# Patient Record
Sex: Female | Born: 1970 | Race: White | Hispanic: No | Smoking: Current every day smoker
Health system: Southern US, Community
[De-identification: ages and names within clinical notes are randomized; demographics above are authoritative.]

## PROBLEM LIST (undated history)

## (undated) DIAGNOSIS — F419 Anxiety disorder, unspecified: Secondary | ICD-10-CM

## (undated) DIAGNOSIS — F32A Depression, unspecified: Secondary | ICD-10-CM

## (undated) DIAGNOSIS — R87629 Unspecified abnormal cytological findings in specimens from vagina: Secondary | ICD-10-CM

## (undated) DIAGNOSIS — F329 Major depressive disorder, single episode, unspecified: Secondary | ICD-10-CM

## (undated) HISTORY — DX: Anxiety disorder, unspecified: F41.9

## (undated) HISTORY — DX: Depression, unspecified: F32.A

## (undated) HISTORY — DX: Unspecified abnormal cytological findings in specimens from vagina: R87.629

## (undated) HISTORY — DX: Major depressive disorder, single episode, unspecified: F32.9

---

## 2015-05-25 ENCOUNTER — Other Ambulatory Visit: Payer: Self-pay | Admitting: Nurse Practitioner

## 2015-05-25 DIAGNOSIS — N6310 Unspecified lump in the right breast, unspecified quadrant: Secondary | ICD-10-CM

## 2015-06-04 ENCOUNTER — Ambulatory Visit
Admission: RE | Admit: 2015-06-04 | Discharge: 2015-06-04 | Disposition: A | Discharge status: Home / Self Care | Payer: Medicaid Other | Source: Ambulatory Visit | Attending: Nurse Practitioner | Admitting: Nurse Practitioner

## 2015-06-04 DIAGNOSIS — N6310 Unspecified lump in the right breast, unspecified quadrant: Secondary | ICD-10-CM

## 2015-06-09 ENCOUNTER — Ambulatory Visit
Admission: RE | Admit: 2015-06-09 | Discharge: 2015-06-09 | Disposition: A | Discharge status: Home / Self Care | Payer: Medicaid Other | Source: Ambulatory Visit | Attending: Nurse Practitioner | Admitting: Nurse Practitioner

## 2015-12-03 ENCOUNTER — Other Ambulatory Visit: Payer: Self-pay | Admitting: Nurse Practitioner

## 2015-12-03 DIAGNOSIS — N6310 Unspecified lump in the right breast, unspecified quadrant: Secondary | ICD-10-CM

## 2015-12-15 ENCOUNTER — Other Ambulatory Visit: Payer: Medicaid Other

## 2015-12-21 ENCOUNTER — Ambulatory Visit
Admission: RE | Admit: 2015-12-21 | Discharge: 2015-12-21 | Disposition: A | Discharge status: Home / Self Care | Payer: Medicaid Other | Source: Ambulatory Visit | Attending: Nurse Practitioner | Admitting: Nurse Practitioner

## 2015-12-21 DIAGNOSIS — N6310 Unspecified lump in the right breast, unspecified quadrant: Secondary | ICD-10-CM

## 2016-03-20 ENCOUNTER — Encounter: Payer: Self-pay | Admitting: General Practice

## 2016-04-20 ENCOUNTER — Other Ambulatory Visit (HOSPITAL_COMMUNITY)
Admission: RE | Admit: 2016-04-20 | Discharge: 2016-04-20 | Disposition: A | Discharge status: Home / Self Care | Payer: Medicaid Other | Source: Ambulatory Visit | Attending: Obstetrics and Gynecology | Admitting: Obstetrics and Gynecology

## 2016-04-20 ENCOUNTER — Ambulatory Visit (INDEPENDENT_AMBULATORY_CARE_PROVIDER_SITE_OTHER): Payer: Medicaid Other | Admitting: Obstetrics and Gynecology

## 2016-04-20 ENCOUNTER — Ambulatory Visit (INDEPENDENT_AMBULATORY_CARE_PROVIDER_SITE_OTHER): Payer: Self-pay | Admitting: Clinical

## 2016-04-20 ENCOUNTER — Encounter: Payer: Self-pay | Admitting: Obstetrics and Gynecology

## 2016-04-20 VITALS — BP 135/80 | HR 79 | Wt 150.9 lb

## 2016-04-20 DIAGNOSIS — Z01419 Encounter for gynecological examination (general) (routine) without abnormal findings: Secondary | ICD-10-CM | POA: Diagnosis not present

## 2016-04-20 DIAGNOSIS — Z8041 Family history of malignant neoplasm of ovary: Secondary | ICD-10-CM

## 2016-04-20 DIAGNOSIS — Z0000 Encounter for general adult medical examination without abnormal findings: Secondary | ICD-10-CM

## 2016-04-20 DIAGNOSIS — Z8481 Family history of carrier of genetic disease: Secondary | ICD-10-CM

## 2016-04-20 DIAGNOSIS — F411 Generalized anxiety disorder: Secondary | ICD-10-CM

## 2016-04-20 DIAGNOSIS — Z1151 Encounter for screening for human papillomavirus (HPV): Secondary | ICD-10-CM | POA: Insufficient documentation

## 2016-04-20 NOTE — Addendum Note (Signed)
Addended by: Shelly Coss on: 04/20/2016 10:15 AM   Modules accepted: Orders

## 2016-04-20 NOTE — Progress Notes (Signed)
Subjective:     Jasmine Soto is a 46 y.o. female G3P1 with LMP 04/03/2016 who is here for a comprehensive physical exam. The patient reports prolonged periods. They occur monthly and last 10 days. This changes started a year ago. Her period previously lasted 7 days. She reports a heavier flow with passage of clots. Patient also reports a family history of ovarian cancer and she is concerned that she may have it as well. She noted an increase in her abdominal girt over the past year. She reports a good appetite and an 8 lb weight gain over the past year. She denies pelvic pain or pressure. She denies urinary incontinence  Past Medical History:  Diagnosis Date  . Anxiety   . Depression   . Vaginal Pap smear, abnormal    History reviewed. No pertinent surgical history. Family History  Problem Relation Age of Onset  . Cancer Mother     ovarian  . Cancer Sister     ovarian    Social History   Social History  . Marital status: Legally Separated    Spouse name: N/A  . Number of children: N/A  . Years of education: N/A   Occupational History  . Not on file.   Social History Main Topics  . Smoking status: Current Every Day Smoker    Packs/day: 0.50    Years: 33.00    Types: Cigarettes  . Smokeless tobacco: Never Used  . Alcohol use 1.8 oz/week    3 Glasses of wine per week  . Drug use: No  . Sexual activity: Yes    Birth control/ protection: None   Other Topics Concern  . Not on file   Social History Narrative  . No narrative on file   Health Maintenance  Topic Date Due  . HIV Screening  11/26/1984  . TETANUS/TDAP  11/26/1988  . PAP SMEAR  11/27/1990  . INFLUENZA VACCINE  10/05/2015       Review of Systems Pertinent items are noted in HPI.   Objective:      GENERAL: Well-developed, well-nourished female in no acute distress.  HEENT: Normocephalic, atraumatic. Sclerae anicteric.  NECK: Supple. Normal thyroid.  LUNGS: Clear to auscultation bilaterally.  HEART:  Regular rate and rhythm. BREASTS: Symmetric in size. No palpable masses or lymphadenopathy, skin changes, or nipple drainage. ABDOMEN: Soft, nontender, nondistended. Palpable lower pelvic mass PELVIC: Normal external female genitalia. Vagina is pink and rugated.  Normal discharge. Normal appearing cervix. Uterus is 12-weeks in size, mobile ? Fibroids on the patient's left side. No adnexal mass or tenderness. EXTREMITIES: No cyanosis, clubbing, or edema, 2+ distal pulses.    Assessment:    Healthy female exam.      Plan:    Pap smear collected Patient scheduled for mammogram next month Patient advised to perform a monthly self breast and vulva exam Pelvic ultrasound ordered BRCA testing offered today Patient will be contacted with any abnormal results RTC following ultrasound results and BRCA testing for further management See After Visit Summary for Counseling Recommendations

## 2016-04-20 NOTE — BH Specialist Note (Addendum)
Session Start time: 9:30   End Time: 10:00 Total Time:  30 minutes Type tof Service: [place] Interpreter: No.   Interpreter Name & Language: n/a # [facility] Visits July 2017-June 2018: 1s  SUBJECTIVE: Jasmine Soto is a 46 y.o. female  Pt. was referred by Dr Elly Modena for:  anxiety and depression. Pt. reports the following symptoms/concerns: Pt states that current BH meds are not working as well as previous, and primary concern is coping daily while re-establishing with psychiatry; has been on [facility] meds intermittently for at least 20 years. Pt's primary concern today is uncertainty over health, after losing both mother and sister to ovarian cancer in less than two years prior. Duration of problem:  Anxiety over two decades Severity: moderately severe Previous treatment: Treated with BH meds for about two decades  OBJECTIVE: Mood: Anxious & Affect: Appropriate Risk of harm to self or others: No known risk of harm to self or others Assessments administered: PHQ9: 16/ GAD7: 18  LIFE CONTEXT:  Family & Social: Lives with husband  School/ Work: Currently unemployed Self-Care: medication as self-care for anxiety Life changes: Mother and sister passed away in past 2 years What is important to pt/family (values): Staying healthy  GOALS ADDRESSED:  -Reduce symptoms of anxiety  INTERVENTIONS: Motivational Interviewing   ASSESSMENT:  Pt currently experiencing Generalized anxiety disorder.  Pt may benefit from psychoeducation, brief therapeutic intervention regarding coping with symptoms os anxiety, as well as referral to psychiatry for [facility] med management.Marland Kitchen   PLAN: 1. F/U with behavioral health clinician: One month, if not established with psychiatry prior to that time 2. Behavioral Health meds: Clonazepam 3. Behavioral recommendations:  -Establish care with Dr. Darleene Cleaver for [facility] med management -Read educational material regarding coping with sympoms of anxiety and  depression -Consider calm and sleep apps for additional self-coping strategy 4. Referral: Brief Counseling/Psychotherapy, Psychoeducation and Referral to [place] provider 5. From scale of 1-10, how likely are you to follow plan: [place]:   Warm Hand Off Completed.        Depression screen [facility] 2/9 04/20/2016 04/20/2016  Decreased Interest 3 3  Down, Depressed, Hopeless 3 3  PHQ - 2 Score 6 6  Altered sleeping 2 2  Tired, decreased energy 2 2  Change in appetite 2 2  Feeling bad or failure about yourself  2 2  Trouble concentrating 2 -  Suicidal thoughts - 0  PHQ-9 Score 16 14   GAD 7 : Generalized Anxiety Score 04/20/2016 04/20/2016  Nervous, Anxious, on Edge 3 3  Control/stop worrying 3 3  Worry too much - different things 3 3  Trouble relaxing 3 3  Restless 1 1  Easily annoyed or irritable 3 -  Afraid - awful might happen 2 2  Total GAD 7 Score 18 -

## 2016-04-24 LAB — CYTOLOGY - PAP
ADEQUACY: ABSENT
Diagnosis: NEGATIVE
HPV (WINDOPATH): NOT DETECTED

## 2016-04-28 ENCOUNTER — Ambulatory Visit (HOSPITAL_COMMUNITY)
Admission: RE | Admit: 2016-04-28 | Discharge: 2016-04-28 | Disposition: A | Discharge status: Home / Self Care | Payer: Medicaid Other | Source: Ambulatory Visit | Attending: Obstetrics and Gynecology | Admitting: Obstetrics and Gynecology

## 2016-04-28 ENCOUNTER — Telehealth: Payer: Self-pay

## 2016-04-28 ENCOUNTER — Ambulatory Visit: Payer: Self-pay

## 2016-04-28 DIAGNOSIS — D250 Submucous leiomyoma of uterus: Secondary | ICD-10-CM | POA: Insufficient documentation

## 2016-04-28 DIAGNOSIS — Z01419 Encounter for gynecological examination (general) (routine) without abnormal findings: Secondary | ICD-10-CM | POA: Insufficient documentation

## 2016-04-28 DIAGNOSIS — D252 Subserosal leiomyoma of uterus: Secondary | ICD-10-CM | POA: Insufficient documentation

## 2016-04-28 DIAGNOSIS — N83201 Unspecified ovarian cyst, right side: Secondary | ICD-10-CM | POA: Insufficient documentation

## 2016-04-28 NOTE — Telephone Encounter (Signed)
Patient has an upcoming appointment to discuss treatment options.

## 2016-05-01 ENCOUNTER — Other Ambulatory Visit: Payer: Self-pay | Admitting: Obstetrics and Gynecology

## 2016-05-01 ENCOUNTER — Telehealth: Payer: Self-pay | Admitting: *Deleted

## 2016-05-01 ENCOUNTER — Telehealth: Payer: Self-pay | Admitting: General Practice

## 2016-05-01 DIAGNOSIS — Z1501 Genetic susceptibility to malignant neoplasm of breast: Secondary | ICD-10-CM

## 2016-05-01 DIAGNOSIS — Z1509 Genetic susceptibility to other malignant neoplasm: Principal | ICD-10-CM

## 2016-05-01 NOTE — Telephone Encounter (Signed)
Patient called and left message requesting ultrasound results. Called and informed patient of ultrasound results & that treatment and the results would be discussed in further detail at her follow up appt. Patient verbalized understanding & had no questions

## 2016-05-01 NOTE — Telephone Encounter (Addendum)
Called pt and left message stating that I am calling with test results and information from Dr. Elly Modena. This information is not urgent. Please call back and leave a message stating whether we may leave a detailed message on her voice mail. *Per Dr. Elly Modena, the ultrasound shows the presence of a fibroid uterus. The BRCA test is still pending. Pt may have Rx for Megace 40 mg daily if desired to control the bleeding until she has follow up appt in office.  Dr. Elly Modena requested that staff send e-prescription for Megace if pt is interested.   3/5 1630  Called pt again and left message stating that I am trying to reach her with information from Dr. Elly Modena. Please call back.   3/14 1640  Called pt and left message asking her to please call back and state that she has received our messages. Also let us know if we may leave detailed information on her voice mail. In addition to the previously mentioned information pt needs to be informed that we have received a call from the lab stating that they are unable to process the [facility] test because it is not covered by her health plan. Dr. Elly Modena has been notified of this information.

## 2016-05-17 LAB — BRCAVANTAGE, COMPREHENSIVE

## 2016-05-23 ENCOUNTER — Ambulatory Visit (INDEPENDENT_AMBULATORY_CARE_PROVIDER_SITE_OTHER): Payer: Self-pay | Admitting: Obstetrics and Gynecology

## 2016-05-23 ENCOUNTER — Encounter: Payer: Self-pay | Admitting: Obstetrics and Gynecology

## 2016-05-23 ENCOUNTER — Other Ambulatory Visit: Payer: Self-pay | Admitting: Obstetrics and Gynecology

## 2016-05-23 ENCOUNTER — Ambulatory Visit (INDEPENDENT_AMBULATORY_CARE_PROVIDER_SITE_OTHER): Payer: Self-pay | Admitting: Clinical

## 2016-05-23 VITALS — BP 107/70 | HR 75 | Ht 62.0 in | Wt 146.0 lb

## 2016-05-23 DIAGNOSIS — N938 Other specified abnormal uterine and vaginal bleeding: Secondary | ICD-10-CM

## 2016-05-23 DIAGNOSIS — Z712 Person consulting for explanation of examination or test findings: Secondary | ICD-10-CM

## 2016-05-23 DIAGNOSIS — F32A Depression, unspecified: Secondary | ICD-10-CM

## 2016-05-23 DIAGNOSIS — F411 Generalized anxiety disorder: Secondary | ICD-10-CM

## 2016-05-23 DIAGNOSIS — Z8041 Family history of malignant neoplasm of ovary: Secondary | ICD-10-CM

## 2016-05-23 DIAGNOSIS — F329 Major depressive disorder, single episode, unspecified: Secondary | ICD-10-CM

## 2016-05-23 DIAGNOSIS — Z7189 Other specified counseling: Secondary | ICD-10-CM

## 2016-05-23 MED ORDER — MEDROXYPROGESTERONE ACETATE 10 MG PO TABS: 20.0000 mg | ORAL_TABLET | Freq: Every day | ORAL | 3 refills | Status: AC

## 2016-05-23 NOTE — BH Specialist Note (Signed)
Integrated Behavioral Health Follow Up Visit  MRN: [id number] Name: Jasmine Soto   Session Start time: 3:10 Session End time: 3:27 Total time: 17 minutes Number of Integrated Behavioral Health Clinician visits: 2/10  Type of Service: [place] Interpretor:No. Interpretor Name and Language: n/a   Warm Hand Off Completed.       SUBJECTIVE: Jasmine Soto is a 46 y.o. female accompanied by Female friend. Patient was referred by  Dr Elly Modena for depression and anxiety f/u. Patient reports the following symptoms/concerns: Pt states that she has not established care with psychiatry, nor with a PCP, but feels ready to make necessary calls before the end of this week to make that happen. Duration of problem: Anxiety over two decades; Severity of problem: severe  OBJECTIVE: Mood: Anxious and Affect: Appropriate Risk of harm to self or others: No plan to harm self or others   LIFE CONTEXT: Family and Social: Lives by herself School/Work: Currently unemployed Self-Care: Medication for anxiety Life Changes: Past two years, mother and sister passed away  GOALS ADDRESSED: Patient will reduce symptoms of: anxiety and depression and increase knowledge and/or ability of: self-management skills and also: Establish care with psychiatry and PCP  INTERVENTIONS: Motivational Interviewing Standardized Assessments completed: GAD-7 and PHQ 9  ASSESSMENT: Patient currently experiencing Generalized anxiety disorder. Patient may benefit from brief therapeutic intervention regarding coping with anxiety.  PLAN: 1. Follow up with behavioral health clinician on : One week via phone check, two weeks office visit(if symptoms do not improve) 2. Behavioral recommendations:  -Establish care with psychiatry for [facility] med management, within upcoming week -Establish care with PCP, within upcoming week 3. Referral(s): [place] (In Clinic),  Psychiatrist and PCP 4. "From scale of 1-10, how likely are you to follow plan?": 8  Garlan Fair, LCSWA  Depression screen [facility] 2/9 05/23/2016 04/20/2016 04/20/2016  Decreased Interest 3 3 3   Down, Depressed, Hopeless 2 3 3   PHQ - 2 Score 5 6 6   Altered sleeping 3 2 2   Tired, decreased energy 3 2 2   Change in appetite 3 2 2   Feeling bad or failure about yourself  2 2 2   Trouble concentrating 2 2 -  Moving slowly or fidgety/restless 2 - -  Suicidal thoughts 0 - 0  PHQ-9 Score 20 16 14    GAD 7 : Generalized Anxiety Score 05/23/2016 04/20/2016 04/20/2016  Nervous, Anxious, on Edge 2 3 3   Control/stop worrying 3 3 3   Worry too much - different things 3 3 3   Trouble relaxing 3 3 3   Restless 2 1 1   Easily annoyed or irritable 3 3 -  Afraid - awful might happen 2 2 2   Total GAD 7 Score 18 18 -

## 2016-05-23 NOTE — Progress Notes (Signed)
46 yo G3P1 presented today to discuss results of ultrasound and further management of vaginal bleeding. Patient reports persistent prolonged periods lasting 10 days with passage of clots.  Past Medical History:  Diagnosis Date  . Anxiety   . Depression   . Vaginal Pap smear, abnormal    No past surgical history on file. Family History  Problem Relation Age of Onset  . Cancer Mother     ovarian  . Cancer Sister     ovarian   Social History  Substance Use Topics  . Smoking status: Current Every Day Smoker    Packs/day: 0.50    Years: 33.00    Types: Cigarettes  . Smokeless tobacco: Never Used  . Alcohol use 1.8 oz/week    3 Glasses of wine per week   ROS  See pertinent in HPI  Blood pressure 107/70, pulse 75, height 5' 2"  (1.575 m), weight 146 lb (66.2 kg), last menstrual period 04/24/2016. GENERAL: Well-developed, well-nourished female in no acute distress.  NEURO: alert and oriented x 3  FINDINGS: Uterus  Measurements: 13.5 x 8.1 x 11.4 cm. Multiple uterine leiomyomata identified. Submucosal leiomyoma at upper to mid uterine segment 4.2 x 3.9 x 4.4 cm. Two additional probable leiomyomata at LEFT lateral uterus, 6.6 x 5.6 x 4.9 cm and 3.5 x 4.1 x 2.9 cm.  Endometrium  Thickness: 13 mm thick, impinged upon by a large submucosal leiomyoma as above  Right ovary  No normal appearing RIGHT ovary in tissues identified. RIGHT adnexal cyst 2.8 x 2.3 x 2.5 cm, question RIGHT ovarian versus paraovarian  Left ovary  Measurements: 3.2 x 2.7 x 2.7 cm. Dominant follicle 2.1 cm. No additional ovarian mass.  Other findings  No free pelvic fluid or additional adnexal masses  IMPRESSION: Multiple probable uterine leiomyomata including a 6.6 cm diameter LEFT lateral subserosal leiomyoma and a 4.4 cm submucosal leiomyoma impinging upon the endometrial canal. Small RIGHT adnexal cyst 2.8 cm diameter without separate visualization of a RIGHT  ovary.   Electronically Signed   By: Lavonia Dana M.D.   On: 04/28/2016 11:26  A/P 46 yo with DUB and fibroid uterus - Discussed medical management of heavy periods with provera. If successful may consider Mirena IUD - Patient is willing to pay for BRCA testing. Discussed surgical management options of both DUB with prophylactic salpingoophorectomy if BRCA positive. - Also discussed hysterectomy with salpingoophorectomy if BRCA negative for the treatment of DUB and decrease risk of ovarian ca - patient desires to defer surgical interventions at this time.  - Rx provera provided - Patient will be contacted with results

## 2016-05-23 NOTE — Progress Notes (Signed)
Patient and/or legal guardian verbally consented to meet with Behavioral Health Clinician about presenting concerns.   

## 2016-05-24 NOTE — Telephone Encounter (Signed)
Pt seen on 3/20.

## 2016-05-30 ENCOUNTER — Telehealth: Payer: Self-pay | Admitting: Clinical

## 2016-05-30 NOTE — Telephone Encounter (Signed)
Attempt to f/u with patient; left HIPPA-compliant message to return call to [phone], to [place] at [facility] for [facility] at [facility].

## 2016-06-15 LAB — COMPREHENSIVE BRCA1/2 ANALYSIS

## 2016-06-15 LAB — BRCASSURE COMPREHENSIVE TEST

## 2016-06-16 ENCOUNTER — Telehealth: Payer: Self-pay

## 2016-06-16 NOTE — Telephone Encounter (Signed)
Received called from lab (Dagny) about patient BRCA which was positive for ovarian and breast cancer. They recommended patient family get this testing as well. I have ask them to  fax over the results but nothing has came over yet. Lab direct number is [phone]

## 2016-06-21 ENCOUNTER — Telehealth: Payer: Self-pay | Admitting: *Deleted

## 2016-06-21 NOTE — Telephone Encounter (Signed)
Pt left message today @ 1314 stating she got a cal regarding a referral appt and wants to know what is going on. She is very worried. She can be reached @ [phone] or [phone]. Called pt back and informed her of appt made w/Dr. Donna Christen due to [facility] test result. This does not mean she has cancer but that she is at a higher risk to develop cancer later in life. Pt voiced understanding and stated she has spoken with a representative from the [place] earlier today and was given instructions about the appt. Marland Kitchen

## 2016-06-21 NOTE — Telephone Encounter (Signed)
Barbaraann Share, a nurse with Dr garrett at gyn/onc at the [facility] left message. She was under the impression that Dr Elly Modena wanted the patient to have a referral to Dr Donna Christen, however no referral order has been received. Dr Derek Mound will be in clinic 4/25 this month and there is a new patient opening. Dr Donna Christen will also be in clinic 5/16 and 6/27. Please call if the referral is needed so she can get scheduled.

## 2016-06-21 NOTE — Telephone Encounter (Signed)
Patient needs referral to gyn/onc per Dr Elly Modena for positive brca test. This does not mean she has cancer, just that she is high risk for cancer in the future. Scheduled appt with gyn/onc for 4/25 @ 830, patient should arrive at 815 for appt with Dr Donna Christen. Called patient, no answer- left message stating we are trying to reach you regarding results & an appt, please call us back.

## 2016-06-21 NOTE — Telephone Encounter (Signed)
I have called the patient back and gave her the date/time for her appt with Dr. Alycia Rossetti on April 25th at 71am

## 2016-06-21 NOTE — Telephone Encounter (Signed)
Received a call from [facility] regarding a referral on this patient. Appt made and nurse will call the patient.

## 2016-06-27 DIAGNOSIS — Z1509 Genetic susceptibility to other malignant neoplasm: Secondary | ICD-10-CM

## 2016-06-27 DIAGNOSIS — Z1501 Genetic susceptibility to malignant neoplasm of breast: Secondary | ICD-10-CM | POA: Insufficient documentation

## 2016-06-28 ENCOUNTER — Encounter: Payer: Self-pay | Admitting: Gynecologic Oncology

## 2016-06-28 ENCOUNTER — Other Ambulatory Visit: Payer: Self-pay | Admitting: Gynecologic Oncology

## 2016-06-28 ENCOUNTER — Ambulatory Visit: Payer: Self-pay | Attending: Gynecologic Oncology | Admitting: Gynecologic Oncology

## 2016-06-28 VITALS — BP 120/90 | HR 70 | Temp 98.3°F | Resp 20 | Ht 61.61 in | Wt 151.6 lb

## 2016-06-28 DIAGNOSIS — F329 Major depressive disorder, single episode, unspecified: Secondary | ICD-10-CM | POA: Insufficient documentation

## 2016-06-28 DIAGNOSIS — Z800 Family history of malignant neoplasm of digestive organs: Secondary | ICD-10-CM | POA: Insufficient documentation

## 2016-06-28 DIAGNOSIS — Z1501 Genetic susceptibility to malignant neoplasm of breast: Secondary | ICD-10-CM | POA: Insufficient documentation

## 2016-06-28 DIAGNOSIS — D259 Leiomyoma of uterus, unspecified: Secondary | ICD-10-CM | POA: Insufficient documentation

## 2016-06-28 DIAGNOSIS — Z833 Family history of diabetes mellitus: Secondary | ICD-10-CM | POA: Insufficient documentation

## 2016-06-28 DIAGNOSIS — Z803 Family history of malignant neoplasm of breast: Secondary | ICD-10-CM | POA: Insufficient documentation

## 2016-06-28 DIAGNOSIS — N946 Dysmenorrhea, unspecified: Secondary | ICD-10-CM | POA: Insufficient documentation

## 2016-06-28 DIAGNOSIS — F419 Anxiety disorder, unspecified: Secondary | ICD-10-CM | POA: Insufficient documentation

## 2016-06-28 DIAGNOSIS — Z1509 Genetic susceptibility to other malignant neoplasm: Secondary | ICD-10-CM

## 2016-06-28 DIAGNOSIS — F17210 Nicotine dependence, cigarettes, uncomplicated: Secondary | ICD-10-CM | POA: Insufficient documentation

## 2016-06-28 DIAGNOSIS — Z79899 Other long term (current) drug therapy: Secondary | ICD-10-CM | POA: Insufficient documentation

## 2016-06-28 DIAGNOSIS — Z8041 Family history of malignant neoplasm of ovary: Secondary | ICD-10-CM | POA: Insufficient documentation

## 2016-06-28 DIAGNOSIS — Z880 Allergy status to penicillin: Secondary | ICD-10-CM | POA: Insufficient documentation

## 2016-06-28 DIAGNOSIS — Z1502 Genetic susceptibility to malignant neoplasm of ovary: Secondary | ICD-10-CM

## 2016-06-28 NOTE — Progress Notes (Signed)
Consult Note: Gyn-Onc New Patient consultation  Jasmine Soto 46 y.o. female  CC:  Chief Complaint  Patient presents with  . BRCA 1 Gene Mutation Positive    HPI: Patient is seen in consultation today at the request of Dr. Mora Bellman.  Patient is a very pleasant 2 her old gravida 3 para 1 who was diagnosed with a pathogenic mutation of BRCA1. Her mutation is [id number]. Her history is notable for her mother who was diagnosed with ovarian cancer at age of 60 past with the age of 80. Her sister was diagnosed with ovarian cancer at the age of 75 was initially in remission and then suffered a recurrence and died at the age of 77. As a result she underwent genetic testing and was found a positive for BRCA1. She states that she is off this testing for many years. There is only one family member with breast cancer that she is aware. She's a paternal aunt to had breast cancer in her 61s. Paternal grandfather had some type of stomach cancer but he was much older. It is been a very difficult year for her, her only son who is 24 died unexpectedly probably from undiagnosed diabetes and an episode of severe hypoglycemia. She states that when emergency technicians arrived his sugar was 14 and they were not able to survive him. She has one sister who is alive but she is adopted.  She was seen by Dr. Elly Modena who obtained this family history and appropriately ordered the genetic testing. On February 15 she had a Pap smear that was unremarkable. She had negative high risk HPV testing. She didn't undergo a transvaginal ultrasound on 04/28/2016 that revealed the uterus to be 13.5 x 8.1 x 11.4 cm with multiple fibroids. She has submucosal myoma at the upper to mid uterine segment measuring 4.2 x 3.9 x 4.4 cm. She had 2 additional probable myomas on the left lateral uterus measuring 6.6 x 5.6 x 4.9 cm and 3.5 x 4.1 x 2.9 cm. The endometrial stripe is 13 mm. Should her normal appearing right adnexa with questionable  right adnexal cyst measuring 2.8 x 2.3 x 2.5 cm. The left ovary measured 3.2 x 2.7 x 2.7 cm with a dominant follicle measuring 2.1 cm.  She's currently on her menstrual cycle and suffers from fairly significant dysmenorrhea as well as heavy bleeding. Her cycles are about every 21 day she starting to have some vasomotor symptoms. She denies any change in her bowel or bladder habits. She is up-to-date on her mammograms but she did have additional views October 2017 and they recommended diagnostic mammogram and ultrasound in March. She has not yet scheduled this. She has not been seen by a breast surgeon or plastic surgeon yet. She states on the first "stop" on her journey after having her BRCA1 mutation diagnosed. She is currently on her cycle. She comes accompanied by her significant other. She is requesting no exam and she thought that this was primarily consultative visit.  Review of Systems  Constitutional: Denies fever. Skin: No rash Cardiovascular: No chest pain, shortness of breath, or edema  Pulmonary: No cough, + smoker Gastro Intestinal: No nausea, vomiting, constipation, or diarrhea reported. Reporting some bloating and that she feels her stomach is bigger than it has been previously. Genitourinary: No frequency, urgency, or dysuria.   Psychology: Slowly adjusting to the loss of her son. She states is been a very difficult year for her.  Current Meds:  Outpatient Encounter Prescriptions as of 06/28/2016  Medication Sig  . ALPRAZolam (XANAX PO) Take 1 mg by mouth.   . gabapentin (NEURONTIN) 800 MG tablet Take 800 mg by mouth at bedtime.  . medroxyPROGESTERone (PROVERA) 10 MG tablet Take 2 tablets (20 mg total) by mouth daily.  Marland Kitchen omeprazole (PRILOSEC) 40 MG capsule Take 40 mg by mouth daily.  Marland Kitchen topiramate (TOPAMAX) 25 MG capsule Take 25 mg by mouth 2 (two) times daily. Patient unsure of dose   No facility-administered encounter medications on file as of 06/28/2016.     Allergy:   Allergies  Allergen Reactions  . Penicillins Nausea Only    Social Hx:   Social History   Social History  . Marital status: Legally Separated    Spouse name: N/A  . Number of children: N/A  . Years of education: N/A   Occupational History  . Not on file.   Social History Main Topics  . Smoking status: Current Every Day Smoker    Packs/day: 0.50    Years: 33.00    Types: Cigarettes  . Smokeless tobacco: Never Used  . Alcohol use 1.8 oz/week    3 Glasses of wine per week  . Drug use: No  . Sexual activity: Yes    Birth control/ protection: None   Other Topics Concern  . Not on file   Social History Narrative  . No narrative on file    Past Surgical Hx: History reviewed. No pertinent surgical history.  Past Medical Hx:  Past Medical History:  Diagnosis Date  . Anxiety   . Depression   . Vaginal Pap smear, abnormal     Oncology Hx:   No history exists.    Family Hx:  Family History  Problem Relation Age of Onset  . Cancer Mother 67    ovarian  . Cancer Sister 57    ovarian  . Diabetes Son 34  . Cancer Paternal Aunt 34    breast  . Cancer Paternal Grandfather 79    stomach    Vitals:  Blood pressure 120/90, pulse 70, temperature 98.3 F (36.8 C), temperature source Oral, resp. rate 20, height 5' 1.61" (1.565 m), weight 151 lb 9.6 oz (68.8 kg).  Physical Exam: Well-nourished vulva female in no acute distress. Examination was deferred at the patient's request.  Assessment/Plan: Patient is a 46 year old gravida 3 para 1 who comes in today secondary to being diagnosed with a pathogenic BRCA1 mutation. 1) We discussed her BRCA mutation and the increased risk of both breast as well as ovarian cancer. Her family history appears to be one that is more concerning for ovarian cancer and we know that the phenotype of this mutation can vary between families. We discussed with her that she has anywhere from a 40-70% chance of developing ovarian cancer by the age  of 46. We also discussed the risk of breast cancer and that she has at least a 70% lifetime risk of developing breast cancer by the age of 46. This reduction surgery including removal of the bilateral tubes and ovaries can decrease her risk of ovarian cancer significantly but it will not be 0 as there continues to be a 1-3% risk of developing primary peritoneal carcinoma. We discussed that she would still need an annual CA-125. We also discussed risks reduction surgery and that this involves a number of options that she would need to discuss both with her breast surgeon as well as plastic surgery. Until then I recommended that she be seen in the high risk breast  clinic and undergo intermittent mammograms and MRIs on an every six-month basis.  2) She does have bulk symptoms and painful periods from her fibroids. I think that secondary to her known fibroids with bulk symptoms as well as a small increased risk of uterine serous carcinomas in patients are BRCA1 affected I believe it would be reasonable to proceed with a hysterectomy at the time of her risks reduction surgery. I discussed with her that we will obtain abdominal pelvic washings, that we would remove the adnexa with a 2 cm margin on the blood vessels and of course alert pathology to her diagnosis so that the fallopian tubes and ovaries could be serially sectioned and evaluated appropriately. More than likely this can be accomplished in a minimally invasive fashion. However, as the patient did not wish to have an exam today I will need to defer final decision regarding removed of surgery until we perform an exam. If we cannot proceed with a minimally invasive procedure she will most likely be a candidate for a low transverse incision.  3) She believes that she'll want to have the surgery in the near future but really wants to think about all of the options and think about time off of work. She has printed information regarding recovery from hysterectomy  as well as printed information from the [employer] regarding BRCA mutations. She is aware that I will place a referral for the high risk breast clinic and they will contact her directly with an appointment.  4) We discussed smoking cessation. The patient understands the need to quit smoking and she will address this when she obtains a primary care physician. She also needs to obtain a psychiatrist as she is running out of some her psychiatric medications and her psychiatrist is moved. She was provided with names of psychiatrist and she will take responsibility of contacting him directly.  She will contact me when she wishes to move forward with surgery. She understands that we'll need to proceed with a physical examination to complete her history and physical.  Her questions as well as those of her significant other were elicited in answer to their satisfaction. Greater than 1 hour face-to-face time with spent the patient and her partner of which all the time was spent in consultation.  I appreciate the opportunity to partner in the care of this very pleasant patient. , A., MD 06/28/2016, 3:49 PM  

## 2016-06-28 NOTE — Patient Instructions (Signed)
Total Laparoscopic Hysterectomy A total laparoscopic hysterectomy is a minimally invasive surgery to remove your uterus and cervix. This surgery is performed by making several small cuts (incisions) in your abdomen. It can also be done with a thin, lighted tube (laparoscope) inserted into two small incisions in your lower abdomen. Your fallopian tubes and ovaries can be removed (bilateral salpingo-oophorectomy) during this surgery as well.Benefits of minimally invasive surgery include:  Less pain.  Less risk of blood loss.  Less risk of infection.  Quicker return to normal activities.  Tell a health care provider about:  Any allergies you have.  All medicines you are taking, including vitamins, herbs, eye drops, creams, and over-the-counter medicines.  Any problems you or family members have had with anesthetic medicines.  Any blood disorders you have.  Any surgeries you have had.  Any medical conditions you have. What are the risks? Generally, this is a safe procedure. However, as with any procedure, complications can occur. Possible complications include:  Bleeding.  Blood clots in the legs or lung.  Infection.  Injury to surrounding organs.  Problems with anesthesia.  Early menopause symptoms (hot flashes, night sweats, insomnia).  Risk of conversion to an open abdominal incision.  What happens before the procedure?  Ask your health care provider about changing or stopping your regular medicines.  Do not take aspirin or blood thinners (anticoagulants) for 1 week before the surgery or as told by your health care provider.  Do not eat or drink anything for 8 hours before the surgery or as told by your health care provider.  Quit smoking if you smoke.  Arrange for a ride home after surgery and for someone to help you at home during recovery. What happens during the procedure?  You will be given antibiotic medicine.  An IV tube will be placed in your arm. You  will be given medicine to make you sleep (general anesthetic).  A gas (carbon dioxide) will be used to inflate your abdomen. This will allow your surgeon to look inside your abdomen, perform your surgery, and treat any other problems found if necessary.  Three or four small incisions (often less than 1/2 inch) will be made in your abdomen. One of these incisions will be made in the area of your belly button (navel). The laparoscope will be inserted into the incision. Your surgeon will look through the laparoscope while doing your procedure.  Other surgical instruments will be inserted through the other incisions.  Your uterus may be removed through your vagina or cut into small pieces and removed through the small incisions.  Your incisions will be closed. What happens after the procedure?  The gas will be released from inside your abdomen.  You will be taken to the recovery area where a nurse will watch and check your progress. Once you are awake, stable, and taking fluids well, without other problems, you will return to your room or be allowed to go home.  There is usually minimal discomfort following the surgery because the incisions are so small.  You will be given pain medicine while you are in the hospital and for when you go home. This information is not intended to replace advice given to you by your health care provider. Make sure you discuss any questions you have with your health care provider. Document Released: 12/18/2006 Document Revised: 07/29/2015 Document Reviewed: 09/10/2012 Elsevier Interactive Patient Education  2017 [employer] Total Laparoscopic Hysterectomy, Care After Refer to this sheet in the next few   weeks. These instructions provide you with information on caring for yourself after your procedure. Your health care provider may also give you more specific instructions. Your treatment has been planned according to current medical practices, but problems sometimes  occur. Call your health care provider if you have any problems or questions after your procedure. What can I expect after the procedure?  Pain and bruising at the incision sites. You will be given pain medicine to control it.  Menopausal symptoms such as hot flashes, night sweats, and insomnia if your ovaries were removed.  Sore throat from the breathing tube that was inserted during surgery. Follow these instructions at home:  Only take over-the-counter or prescription medicines for pain, discomfort, or fever as directed by your health care provider.  Do not take aspirin. It can cause bleeding.  Do not drive when taking pain medicine.  Follow your health care provider's advice regarding diet, exercise, lifting, driving, and general activities.  Resume your usual diet as directed and allowed.  Get plenty of rest and sleep.  Do not douche, use tampons, or have sexual intercourse for at least 6 weeks, or until your health care provider gives you permission.  Change your bandages (dressings) as directed by your health care provider.  Monitor your temperature and notify your health care provider of a fever.  Take showers instead of baths for 2-3 weeks.  Do not drink alcohol until your health care provider gives you permission.  If you develop constipation, you may take a mild laxative with your health care provider's permission. Bran foods may help with constipation problems. Drinking enough fluids to keep your urine clear or pale yellow may help as well.  Try to have someone home with you for 1-2 weeks to help around the house.  Keep all of your follow-up appointments as directed by your health care provider. Contact a health care provider if:  You have swelling, redness, or increasing pain around your incision sites.  You have pus coming from your incision.  You notice a bad smell coming from your incision.  Your incision breaks open.  You feel dizzy or  lightheaded.  You have pain or bleeding when you urinate.  You have persistent diarrhea.  You have persistent nausea and vomiting.  You have abnormal vaginal discharge.  You have a rash.  You have any type of abnormal reaction or develop an allergy to your medicine.  You have poor pain control with your prescribed medicine. Get help right away if:  You have chest pain or shortness of breath.  You have severe abdominal pain that is not relieved with pain medicine.  You have pain or swelling in your legs. This information is not intended to replace advice given to you by your health care provider. Make sure you discuss any questions you have with your health care provider. Document Released: 12/11/2012 Document Revised: 07/29/2015 Document Reviewed: 09/10/2012 Elsevier Interactive Patient Education  2017 [employer]  

## 2016-07-21 ENCOUNTER — Telehealth: Payer: Self-pay | Admitting: *Deleted

## 2016-07-21 NOTE — Telephone Encounter (Signed)
Patient called and made an appt to see Dr. Alycia Rossetti for possible surgery. Appt made on June 27th at 10:15am.

## 2016-08-30 ENCOUNTER — Telehealth: Payer: Self-pay | Admitting: Gynecologic Oncology

## 2016-08-30 ENCOUNTER — Ambulatory Visit: Payer: Self-pay | Admitting: Gynecologic Oncology

## 2016-08-30 NOTE — Progress Notes (Deleted)
Gynecologic Oncology Return Patient Visit  Jasmine Soto 46 y.o. female  CC:  No chief complaint on file.   HPI: Patient was seen in consultation today at the request of Dr. Mora Bellman.  Patient is a very pleasant 71 her old gravida 3 para 1 who was diagnosed with a pathogenic mutation of BRCA1. Her mutation is [id number]. Her history is notable for her mother who was diagnosed with ovarian cancer at age of 46 past with the age of 51. Her sister was diagnosed with ovarian cancer at the age of 80 was initially in remission and then suffered a recurrence and died at the age of 31. As a result she underwent genetic testing and was found a positive for BRCA1. She states that she is off this testing for many years. There is only one family member with breast cancer that she is aware. She's a paternal aunt to had breast cancer in her 81s. Paternal grandfather had some type of stomach cancer but he was much older. It is been a very difficult year for her, her only son who is 24 died unexpectedly probably from undiagnosed diabetes and an episode of severe hypoglycemia. She states that when emergency technicians arrived his sugar was 14 and they were not able to survive him. She has one sister who is alive but she is adopted.  She was seen by Dr. Elly Modena who obtained this family history and appropriately ordered the genetic testing. On February 15 she had a Pap smear that was unremarkable. She had negative high risk HPV testing. She didn't undergo a transvaginal ultrasound on 04/28/2016 that revealed the uterus to be 13.5 x 8.1 x 11.4 cm with multiple fibroids. She has submucosal myoma at the upper to mid uterine segment measuring 4.2 x 3.9 x 4.4 cm. She had 2 additional probable myomas on the left lateral uterus measuring 6.6 x 5.6 x 4.9 cm and 3.5 x 4.1 x 2.9 cm. The endometrial stripe is 13 mm. Should her normal appearing right adnexa with questionable right adnexal cyst measuring 2.8 x 2.3 x 2.5 cm. The  left ovary measured 3.2 x 2.7 x 2.7 cm with a dominant follicle measuring 2.1 cm.  On 08/25/2016 she underwent a total robotic hysterectomy bilateral salpingo-oophorectomy with washings. Her surgery was uncomplicated. Her pathology revealed:  A: Uterus, cervix, bilateral tubes and ovaries, hysterectomy and bilateral salpingo-oophorectomy  Cervix: - Benign squamous mucosa and endocervical glands - No dysplasia or malignancy identified  Endomyometrium: - Benign secretory endometrium with tubal metaplasia - Adenomyosis - Leiomyomata with myxoid change, up to 4.5cm in greatest dimension  Left ovary: - Benign ovary with physiologic changes - No atypia or malignancy identified  Left fallopian tube: - Benign fallopian tube and fimbriated end, entirely submitted - No atypia or malignancy identified  Right ovary: - Benign ovary with physiologic changes and corpus luteal cyst (1.4cm) - No atypia or malignancy identified  Right fallopian tube:  - Benign fallopian tube and fimbriated end, entirely submitted - No atypia or malignancy identified  She comes in today for brief postop check.  Review of Systems  Constitutional: Denies fever. Skin: No rash Cardiovascular: No chest pain, shortness of breath, or edema  Pulmonary: No cough, + smoker Gastro Intestinal: No nausea, vomiting, constipation, or diarrhea reported. Reporting some bloating and that she feels her stomach is bigger than it has been previously. Genitourinary: No frequency, urgency, or dysuria.   Psychology: Slowly adjusting to the loss of her son. She states is been a  very difficult year for her.  Current Meds:  Outpatient Encounter Prescriptions as of 08/30/2016  Medication Sig  . ALPRAZolam (XANAX PO) Take 1 mg by mouth.   . gabapentin (NEURONTIN) 800 MG tablet Take 800 mg by mouth at bedtime.  . medroxyPROGESTERone (PROVERA) 10 MG tablet Take 2 tablets (20 mg total) by mouth daily.  Marland Kitchen omeprazole (PRILOSEC) 40 MG  capsule Take 40 mg by mouth daily.  Marland Kitchen topiramate (TOPAMAX) 25 MG capsule Take 25 mg by mouth 2 (two) times daily. Patient unsure of dose   No facility-administered encounter medications on file as of 08/30/2016.     Allergy:  Allergies  Allergen Reactions  . Penicillins Nausea Only    Social Hx:   Social History   Social History  . Marital status: Legally Separated    Spouse name: N/A  . Number of children: N/A  . Years of education: N/A   Occupational History  . Not on file.   Social History Main Topics  . Smoking status: Current Every Day Smoker    Packs/day: 0.50    Years: 33.00    Types: Cigarettes  . Smokeless tobacco: Never Used  . Alcohol use 1.8 oz/week    3 Glasses of wine per week  . Drug use: No  . Sexual activity: Yes    Birth control/ protection: None   Other Topics Concern  . Not on file   Social History Narrative  . No narrative on file    Past Surgical Hx: No past surgical history on file.  Past Medical Hx:  Past Medical History:  Diagnosis Date  . Anxiety   . Depression   . Vaginal Pap smear, abnormal     Oncology Hx:   No history exists.    Family Hx:  Family History  Problem Relation Age of Onset  . Cancer Mother 52       ovarian  . Cancer Sister 47       ovarian  . Diabetes Son 69  . Cancer Paternal Aunt 72       breast  . Cancer Paternal Grandfather 21       stomach    Vitals:  There were no vitals taken for this visit.  Physical Exam: Well-nourished vulva female in no acute distress. Examination was deferred at the patient's request.  Assessment/Plan: Patient is a 46 year old gravida 3 para 1 who comes in today secondary to being diagnosed with a pathogenic BRCA1 mutation. 1) We discussed her BRCA mutation and the increased risk of both breast as well as ovarian cancer. Her family history appears to be one that is more concerning for ovarian cancer and we know that the phenotype of this mutation can vary between  families. We discussed with her that she has anywhere from a 40-70% chance of developing ovarian cancer by the age of 24. We also discussed the risk of breast cancer and that she has at least a 70% lifetime risk of developing breast cancer by the age of 1. This reduction surgery including removal of the bilateral tubes and ovaries can decrease her risk of ovarian cancer significantly but it will not be 0 as there continues to be a 1-3% risk of developing primary peritoneal carcinoma. We discussed that she would still need an annual CA-125. We also discussed risks reduction surgery and that this involves a number of options that she would need to discuss both with her breast surgeon as well as plastic surgery. Until then I recommended that  she be seen in the high risk breast clinic and undergo intermittent mammograms and MRIs on an every six-month basis.  2) She does have bulk symptoms and painful periods from her fibroids. I think that secondary to her known fibroids with bulk symptoms as well as a small increased risk of uterine serous carcinomas in patients are BRCA1 affected I believe it would be reasonable to proceed with a hysterectomy at the time of her risks reduction surgery. I discussed with her that we will obtain abdominal pelvic washings, that we would remove the adnexa with a 2 cm margin on the blood vessels and of course alert pathology to her diagnosis so that the fallopian tubes and ovaries could be serially sectioned and evaluated appropriately. More than likely this can be accomplished in a minimally invasive fashion. However, as the patient did not wish to have an exam today I will need to defer final decision regarding removed of surgery until we perform an exam. If we cannot proceed with a minimally invasive procedure she will most likely be a candidate for a low transverse incision.  3) She believes that she'll want to have the surgery in the near future but really wants to think about all  of the options and think about time off of work. She has printed information regarding recovery from hysterectomy as well as printed information from the [facility] regarding BRCA mutations. She is aware that I will place a referral for the high risk breast clinic and they will contact her directly with an appointment.  4) We discussed smoking cessation. The patient understands the need to quit smoking and she will address this when she obtains a primary care physician. She also needs to obtain a psychiatrist as she is running out of some her psychiatric medications and her psychiatrist is moved. She was provided with names of psychiatrist and she will take responsibility of contacting him directly.  She will contact me when she wishes to move forward with surgery. She understands that we'll need to proceed with a physical examination to complete her history and physical.  Her questions as well as those of her significant other were elicited in answer to their satisfaction. Greater than 1 hour face-to-face time with spent the patient and her partner of which all the time was spent in consultation.  I appreciate the opportunity to partner in the care of this very pleasant patient. Constanza Mincy A., MD 08/30/2016, 8:18 AM

## 2016-08-30 NOTE — Telephone Encounter (Signed)
Patient returned call to the office.  Patient stating she needs more pain medication.  She states she has been taking ibuprofen and tylenol in between but she is out of stronger medication.  Situation discussed with Dr. Alycia Rossetti.  Refill sent through [facility].  Patient advised she must have a follow up appt before any additional medication is given.  Advised to reach out to [facility] about her follow up appt there.  Doing well post-op besides intermittent moderate post-op pain.  Advised to call for any needs

## 2016-10-27 ENCOUNTER — Other Ambulatory Visit: Payer: Self-pay | Admitting: Gynecologic Oncology

## 2018-02-26 IMAGING — US US PELVIS COMPLETE
1 series · 15 of 25 positions shown · non-contrast
Comparison: None

CLINICAL DATA: Pelvic mass

EXAM:
TRANSABDOMINAL AND TRANSVAGINAL ULTRASOUND OF PELVIS
TECHNIQUE: Both transabdominal and transvaginal ultrasound examinations of the
pelvis were performed. Transabdominal technique was performed for
global imaging of the pelvis including uterus, ovaries, adnexal
regions, and pelvic cul-de-sac. It was necessary to proceed with
endovaginal exam following the transabdominal exam to visualize the
uterine masses.

[Series 1: us pelvis complete · 103 acquisitions, 15 frames shown]
[im 1/103]
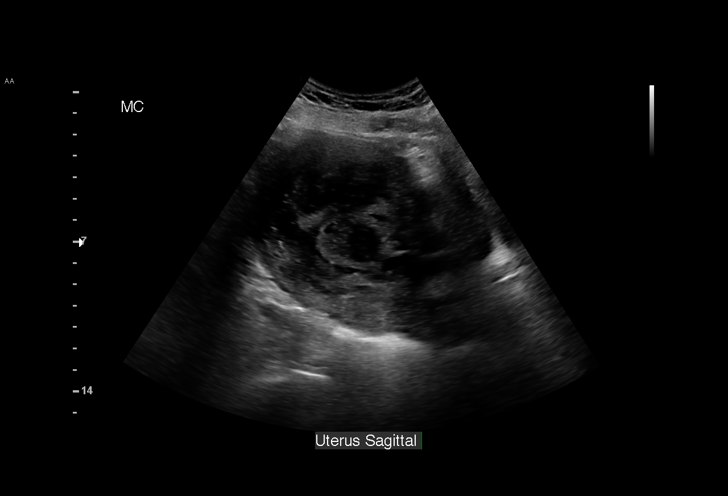
[im 9/103]
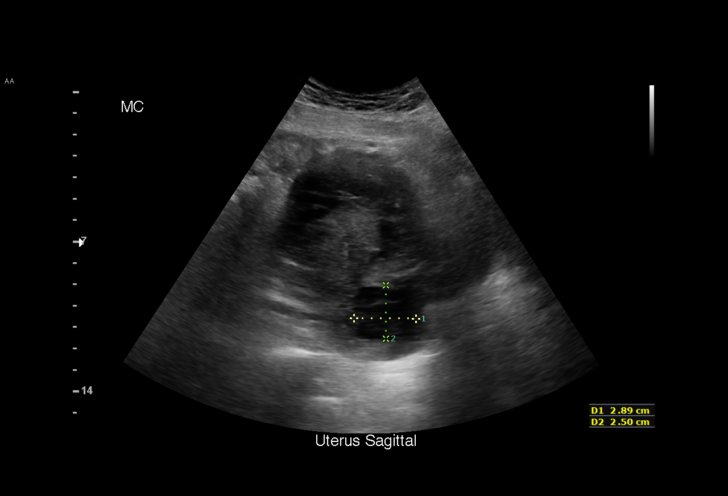
[im 18/103]
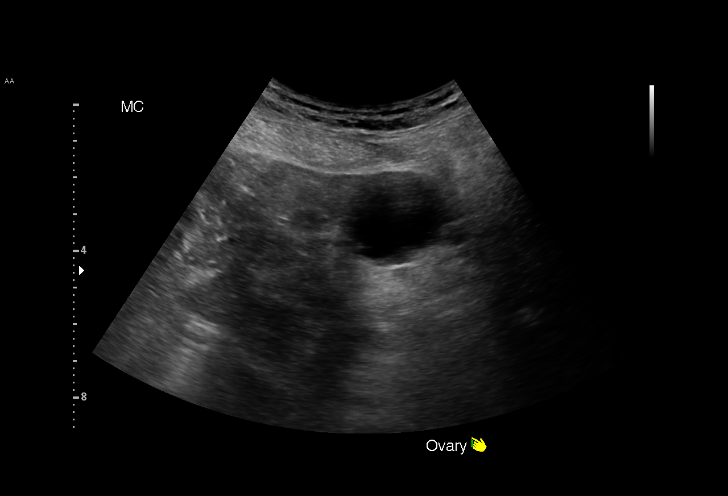
[im 22/103]
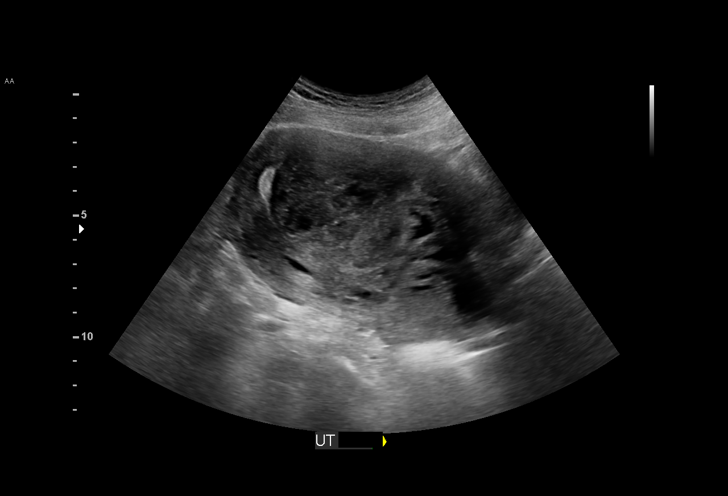
[im 30/103]
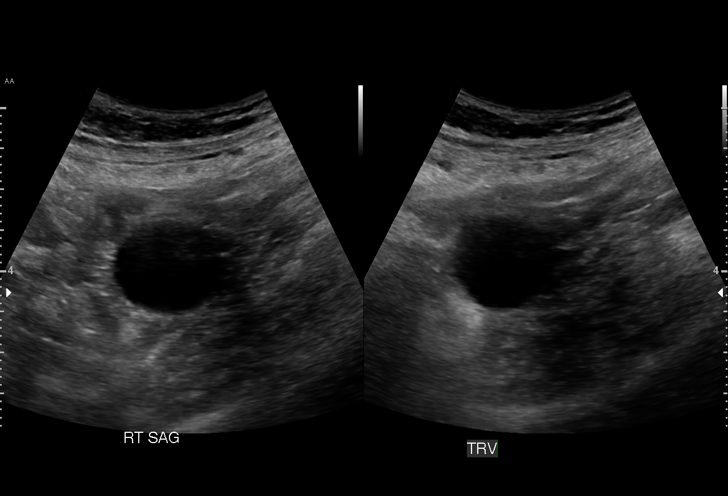
[im 39/103]
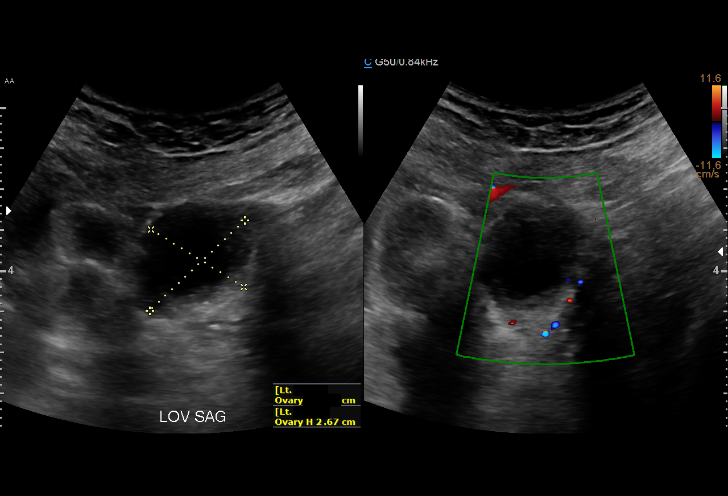
[im 43/103]
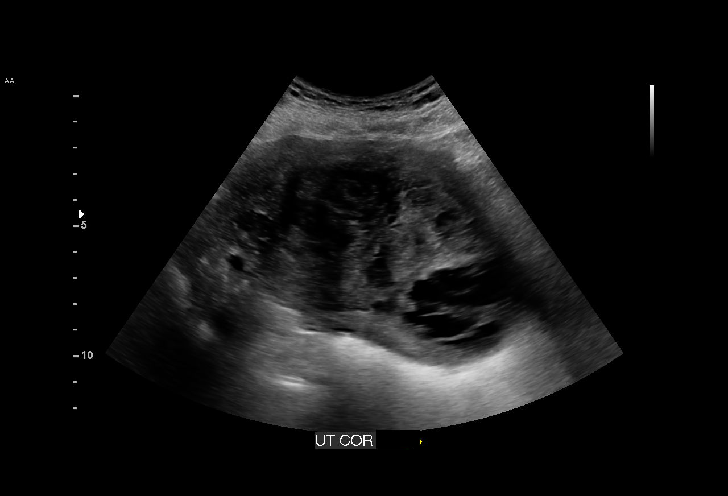
[im 52/103]
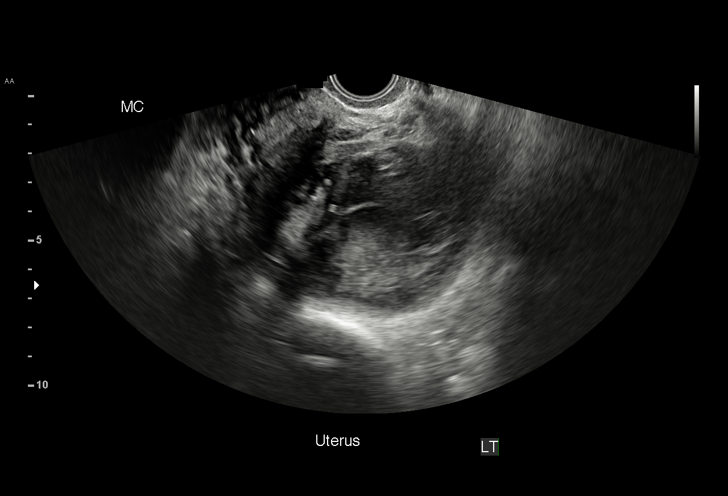
[im 60/103]
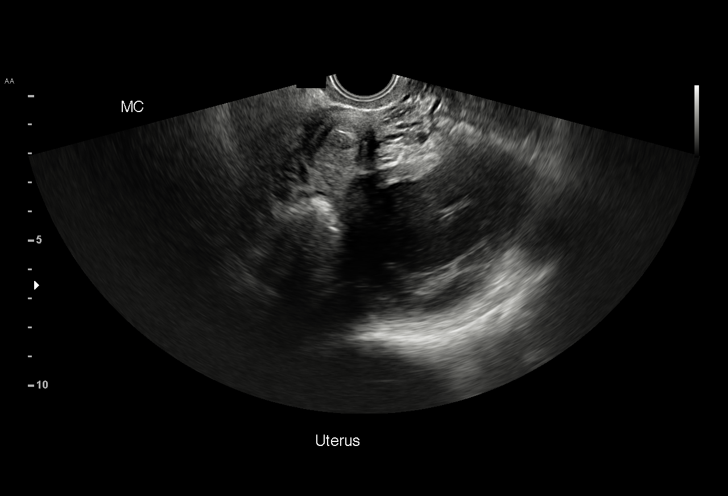
[im 64/103]
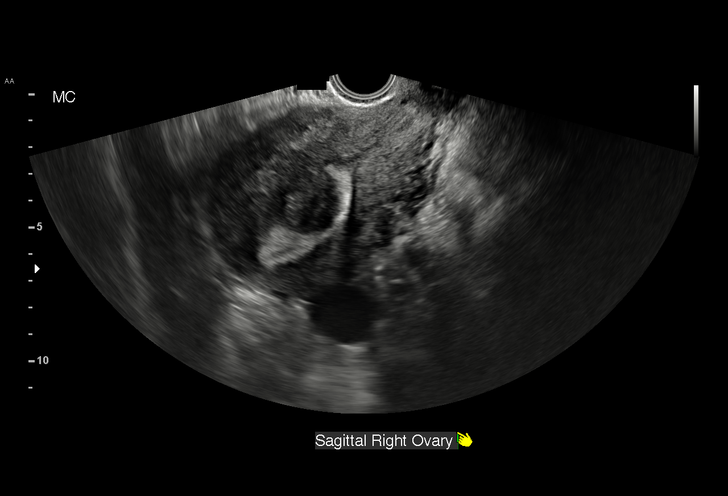
[im 73/103]
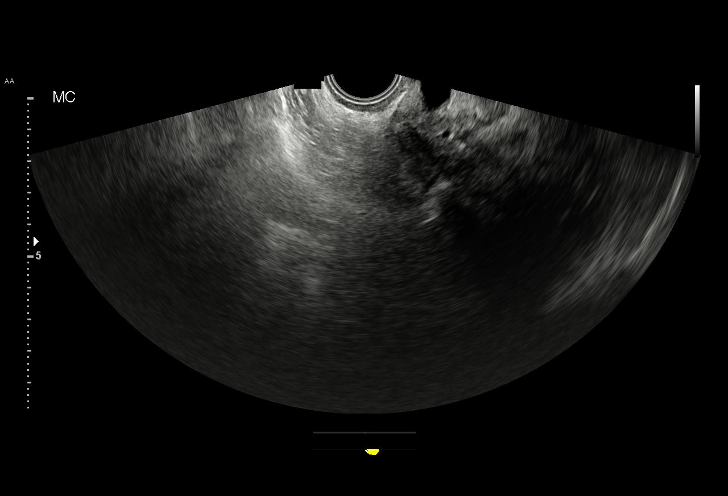
[im 81/103]
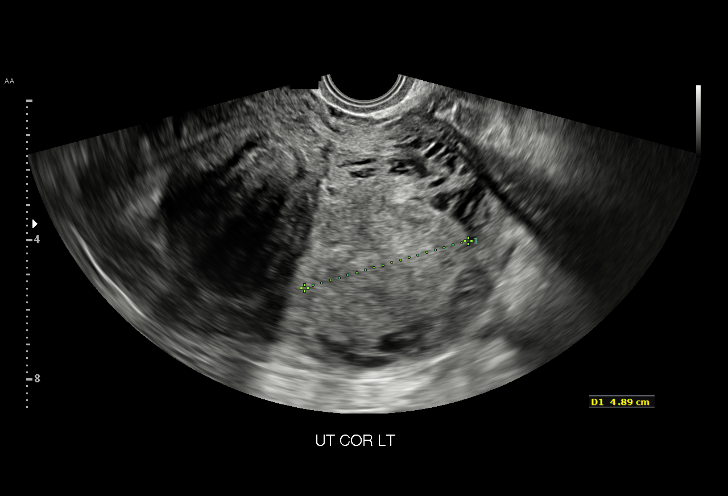
[im 86/103]
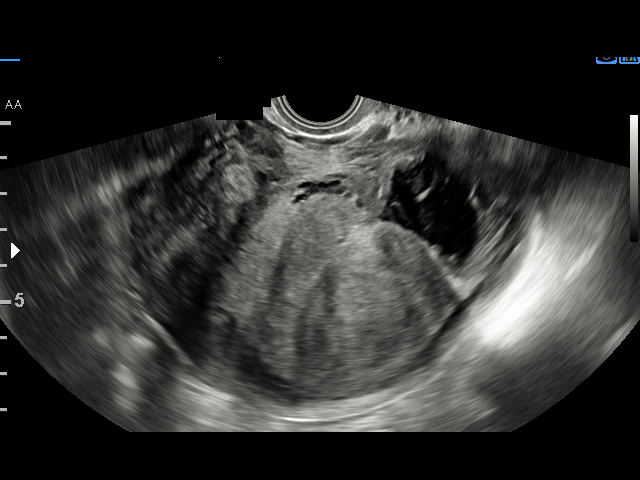
[im 94/103]
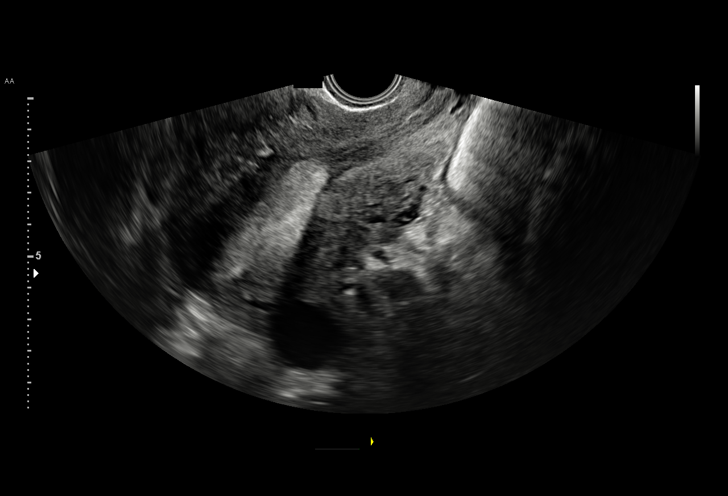
[im 103/103]
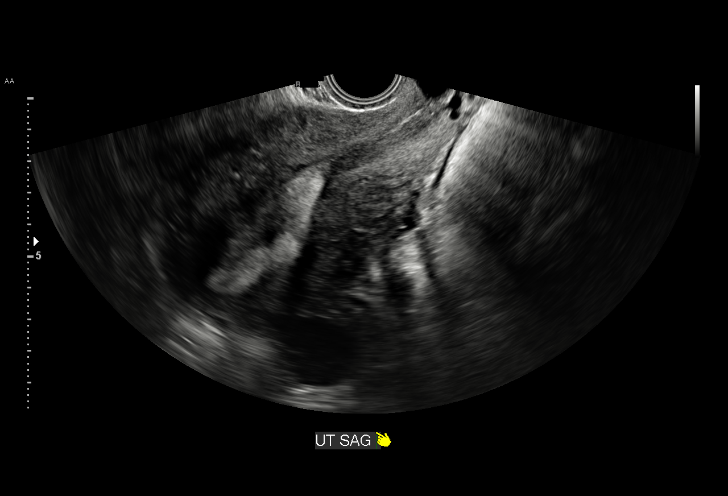

[15 of 25 positions shown; findings below may reference images not displayed]

FINDINGS: Uterus

Measurements: 13.5 x 8.1 x 11.4 cm. Multiple uterine leiomyomata
identified. Submucosal leiomyoma at upper to mid uterine segment
x 3.9 x 4.4 cm. Two additional probable leiomyomata at LEFT lateral
uterus, 6.6 x 5.6 x 4.9 cm and 3.5 x 4.1 x 2.9 cm.

Endometrium

Thickness: 13 mm thick, impinged upon by a large submucosal
leiomyoma as above

Right ovary

No normal appearing RIGHT ovary in tissues identified. RIGHT adnexal
cyst 2.8 x 2.3 x 2.5 cm, question RIGHT ovarian versus paraovarian

Left ovary

Measurements: 3.2 x 2.7 x 2.7 cm. Dominant follicle 2.1 cm. No
additional ovarian mass.

Other findings

No free pelvic fluid or additional adnexal masses
IMPRESSION: Multiple probable uterine leiomyomata including a 6.6 cm diameter
LEFT lateral subserosal leiomyoma and a 4.4 cm submucosal leiomyoma
impinging upon the endometrial canal. Small RIGHT adnexal cyst
cm diameter without separate visualization of a RIGHT ovary.
